# Patient Record
Sex: Female | Born: 1965 | Hispanic: Yes | Marital: Single
Health system: Southern US, Community
[De-identification: ages and names within clinical notes are randomized; demographics above are authoritative.]

---

## 2016-03-15 ENCOUNTER — Emergency Department (HOSPITAL_COMMUNITY): Payer: Self-pay

## 2016-03-15 ENCOUNTER — Encounter (HOSPITAL_COMMUNITY): Payer: Self-pay | Admitting: Certified Registered"

## 2016-03-15 ENCOUNTER — Encounter (HOSPITAL_COMMUNITY): Payer: Self-pay | Admitting: *Deleted

## 2016-03-15 ENCOUNTER — Encounter (HOSPITAL_COMMUNITY)
Admission: EM | Disposition: A | Discharge status: Home / Self Care | Payer: Self-pay | Source: Home / Self Care | Attending: Emergency Medicine

## 2016-03-15 ENCOUNTER — Emergency Department (HOSPITAL_COMMUNITY)
Admission: EM | Admit: 2016-03-15 | Discharge: 2016-03-15 | Disposition: A | Discharge status: Home / Self Care | Payer: Self-pay | Attending: Emergency Medicine | Admitting: Emergency Medicine

## 2016-03-15 DIAGNOSIS — S79912A Unspecified injury of left hip, initial encounter: Secondary | ICD-10-CM | POA: Insufficient documentation

## 2016-03-15 DIAGNOSIS — W11XXXA Fall on and from ladder, initial encounter: Secondary | ICD-10-CM | POA: Insufficient documentation

## 2016-03-15 DIAGNOSIS — M25552 Pain in left hip: Secondary | ICD-10-CM

## 2016-03-15 DIAGNOSIS — Y9389 Activity, other specified: Secondary | ICD-10-CM | POA: Insufficient documentation

## 2016-03-15 DIAGNOSIS — M25512 Pain in left shoulder: Secondary | ICD-10-CM

## 2016-03-15 DIAGNOSIS — Y998 Other external cause status: Secondary | ICD-10-CM | POA: Insufficient documentation

## 2016-03-15 DIAGNOSIS — S4992XA Unspecified injury of left shoulder and upper arm, initial encounter: Secondary | ICD-10-CM | POA: Insufficient documentation

## 2016-03-15 DIAGNOSIS — M75112 Incomplete rotator cuff tear or rupture of left shoulder, not specified as traumatic: Secondary | ICD-10-CM | POA: Insufficient documentation

## 2016-03-15 DIAGNOSIS — S7012XA Contusion of left thigh, initial encounter: Secondary | ICD-10-CM | POA: Insufficient documentation

## 2016-03-15 DIAGNOSIS — Y9289 Other specified places as the place of occurrence of the external cause: Secondary | ICD-10-CM | POA: Insufficient documentation

## 2016-03-15 LAB — CBC WITH DIFFERENTIAL/PLATELET
BASOS ABS: 0.1 10*3/uL (ref 0.0–0.1)
BASOS PCT: 1 %
EOS PCT: 3 %
Eosinophils Absolute: 0.2 10*3/uL (ref 0.0–0.7)
HCT: 35.3 % — ABNORMAL LOW (ref 36.0–46.0)
Hemoglobin: 11.9 g/dL — ABNORMAL LOW (ref 12.0–15.0)
Lymphocytes Relative: 26 %
Lymphs Abs: 2.3 10*3/uL (ref 0.7–4.0)
MCH: 29.5 pg (ref 26.0–34.0)
MCHC: 33.7 g/dL (ref 30.0–36.0)
MCV: 87.4 fL (ref 78.0–100.0)
MONO ABS: 0.5 10*3/uL (ref 0.1–1.0)
Monocytes Relative: 6 %
Neutro Abs: 5.8 10*3/uL (ref 1.7–7.7)
Neutrophils Relative %: 64 %
PLATELETS: 336 10*3/uL (ref 150–400)
RBC: 4.04 MIL/uL (ref 3.87–5.11)
RDW: 12.7 % (ref 11.5–15.5)
WBC: 8.8 10*3/uL (ref 4.0–10.5)

## 2016-03-15 LAB — BASIC METABOLIC PANEL
ANION GAP: 12 (ref 5–15)
BUN: 8 mg/dL (ref 6–20)
CALCIUM: 8.9 mg/dL (ref 8.9–10.3)
CO2: 21 mmol/L — ABNORMAL LOW (ref 22–32)
Chloride: 104 mmol/L (ref 101–111)
Creatinine, Ser: 0.6 mg/dL (ref 0.44–1.00)
GFR calc Af Amer: >60 mL/min (ref >60)
GLUCOSE: 91 mg/dL (ref 65–99)
Potassium: 3.5 mmol/L (ref 3.5–5.1)
Sodium: 137 mmol/L (ref 135–145)

## 2016-03-15 SURGERY — FIXATION, FEMUR, NECK, PERCUTANEOUS, USING SCREW
Anesthesia: General | Laterality: Left

## 2016-03-15 MED ORDER — PROPOFOL 10 MG/ML IV BOLUS
INTRAVENOUS | Status: AC
  Filled 2016-03-15: qty 20

## 2016-03-15 MED ORDER — OXYCODONE-ACETAMINOPHEN 5-325 MG PO TABS
1.0000 | ORAL_TABLET | Freq: Once | ORAL | Status: AC
  Administered 2016-03-15: 1 via ORAL
  Filled 2016-03-15: qty 1

## 2016-03-15 MED ORDER — KETOROLAC TROMETHAMINE 30 MG/ML IJ SOLN
30.0000 mg | Freq: Once | INTRAMUSCULAR | Status: AC
  Administered 2016-03-15: 30 mg via INTRAVENOUS
  Filled 2016-03-15: qty 1

## 2016-03-15 MED ORDER — OXYCODONE-ACETAMINOPHEN 5-325 MG PO TABS
ORAL_TABLET | ORAL | Status: AC
  Filled 2016-03-15: qty 1

## 2016-03-15 MED ORDER — ONDANSETRON HCL 4 MG/2ML IJ SOLN
4.0000 mg | Freq: Once | INTRAMUSCULAR | Status: AC
  Administered 2016-03-15: 4 mg via INTRAVENOUS
  Filled 2016-03-15: qty 2

## 2016-03-15 MED ORDER — MORPHINE SULFATE (PF) 4 MG/ML IV SOLN
4.0000 mg | Freq: Once | INTRAVENOUS | Status: AC
  Administered 2016-03-15: 4 mg via INTRAVENOUS
  Filled 2016-03-15: qty 1

## 2016-03-15 MED ORDER — FENTANYL CITRATE (PF) 250 MCG/5ML IJ SOLN
INTRAMUSCULAR | Status: AC
  Filled 2016-03-15: qty 5

## 2016-03-15 MED ORDER — MIDAZOLAM HCL 2 MG/2ML IJ SOLN
INTRAMUSCULAR | Status: AC
Start: 2016-03-15 — End: 2016-03-15
  Filled 2016-03-15: qty 2

## 2016-03-15 MED ORDER — OXYCODONE-ACETAMINOPHEN 5-325 MG PO TABS
1.0000 | ORAL_TABLET | ORAL | Status: DC | PRN
Start: 2016-03-15 — End: 2016-03-16
  Administered 2016-03-15: 1 via ORAL

## 2016-03-15 MED ORDER — OXYCODONE-ACETAMINOPHEN 5-325 MG PO TABS: 1.0000 | ORAL_TABLET | Freq: Four times a day (QID) | ORAL | Status: AC | PRN

## 2016-03-15 NOTE — Progress Notes (Signed)
No left hip fracture Left shoulder medium supraspinatus tear Needs shoulder immobilizer Ok to wb as tolerated left Needs to be seen in follow up Friday am for surgery next week Will need pain meds and muscle relaxer

## 2016-03-15 NOTE — ED Notes (Signed)
REpaged ortho/DEAN

## 2016-03-15 NOTE — Progress Notes (Signed)
Discussed findings with patient

## 2016-03-15 NOTE — Discharge Instructions (Signed)
Contusin (Contusion) Una contusin es un hematoma profundo. Las contusiones ocurren cuando una lesin causa un sangrado debajo de la piel. Los sntomas de hematoma incluyen dolor, hinchazn y cambio de color en la piel. La piel puede ponerse azul, morada o amarilla. CUIDADOS EN EL HOGAR   Mantenga la zona de la lesin en reposo.  Aplique hielo sobre la zona lesionada, si se lo indican.  Ponga el hielo en una bolsa plstica.  Coloque una toalla entre la piel y la bolsa de hielo.  Coloque el hielo durante 20minutos, 2 a 3veces por da.  Si se lo indican, ejerza una presin suave (compresin) en la zona de la lesin con una venda elstica. Asegrese de que la venda no est muy ajustada. Retrela y vuelva a colocarla como se lo haya indicado el mdico.  Cuando est sentado o acostado, eleve la zona de la lesin por encima del nivel del corazn, si es posible.  Tome los medicamentos de venta libre y los recetados solamente como se lo haya indicado el mdico. SOLICITE AYUDA SI:  Los sntomas no mejoran despus de varios das de tratamiento.  Los sntomas empeoran.  Tiene dificultad para mover la zona de la lesin. SOLICITE AYUDA DE INMEDIATO SI:   Siente mucho dolor.  Pierde la sensibilidad (adormecimiento) en una mano o un pie.  La mano o el pie estn plidos o fros.   Esta informacin no tiene como fin reemplazar el consejo del mdico. Asegrese de hacerle al mdico cualquier pregunta que tenga.   Document Released: 11/30/2011 Document Revised: 09/01/2015 Elsevier Interactive Patient Education 2016 [employer]  

## 2016-03-15 NOTE — ED Provider Notes (Signed)
CSN: [id number]     Arrival date & time 03/15/16  1129 History   First MD Initiated Contact with Patient 03/15/16 1504     Chief Complaint  Patient presents with  . Fall  . Pain     (Consider location/radiation/quality/duration/timing/severity/associated sxs/prior Treatment) HPI Comments: 50 year old female who complains of left hip and left shoulder pain. Just prior to arrival, the patient fell off of a ladder approximately 6 feet. She landed onto her left side. She did not strike her head or lose consciousness. She reports severe, constant pain to her left shoulder and left hip. The pain is worse with any movement. She endorses normal sensation throughout. She denies any other extremity injuries. No back pain.  Patient is a 50 y.o. female presenting with fall. The history is provided by the patient.  Fall    History reviewed. No pertinent past medical history. History reviewed. No pertinent past surgical history. History reviewed. No pertinent family history. Social History  Substance Use Topics  . Smoking status: None  . Smokeless tobacco: None  . Alcohol Use: No   OB History    No data available     Review of Systems 10 Systems reviewed and are negative for acute change except as noted in the HPI.    Allergies  Review of patient's allergies indicates no known allergies.  Home Medications   Prior to Admission medications   Medication Sig Start Date End Date Taking? Authorizing Provider  ibuprofen (ADVIL,MOTRIN) 200 MG tablet Take 800 mg by mouth every 6 (six) hours as needed for headache or mild pain.   Yes Historical Provider, MD  oxyCODONE-acetaminophen (PERCOCET) 5-325 MG tablet Take 1 tablet by mouth every 6 (six) hours as needed for severe pain. 03/15/16   Ambrose Finland Cosmo Tetreault, MD   BP 136/69 mmHg  Pulse 82  Temp(Src) 99.1 F (37.3 C) (Oral)  Resp 15  SpO2 100% Physical Exam  Constitutional: She is oriented to person, place, and time. She appears  well-developed and well-nourished. No distress.  uncomfortable  HENT:  Head: Normocephalic and atraumatic.  Moist mucous membranes  Eyes: Conjunctivae are normal. Pupils are equal, round, and reactive to light.  Neck: Neck supple.  Cardiovascular: Normal rate, regular rhythm and normal heart sounds.   No murmur heard. Pulmonary/Chest: Effort normal and breath sounds normal.  Abdominal: Soft. Bowel sounds are normal. She exhibits no distension. There is no tenderness.  Musculoskeletal: She exhibits no edema.  TTP L shoulder without obvious deformity; normal ROM L elbow and wrist; 5/5 grip strength b/l; 2+ radial pulses; unable to move L hip 2/2 pain  Neurological: She is alert and oriented to person, place, and time.  Normal sensation x all 4 ext, Fluent speech  Skin: Skin is warm and dry.  Psychiatric: She has a normal mood and affect. Judgment normal.  Nursing note and vitals reviewed.   ED Course  Procedures (including critical care time) Labs Review Labs Reviewed  BASIC METABOLIC PANEL - Abnormal; Notable for the following:    CO2 21 (*)    All other components within normal limits  CBC WITH DIFFERENTIAL/PLATELET - Abnormal; Notable for the following:    Hemoglobin 11.9 (*)    HCT 35.3 (*)    All other components within normal limits    Imaging Review Dg Chest 2 View  03/15/2016  CLINICAL DATA:  Fall from ladder. Left rib and chest pain. Unable to raise left arm. Pre-op respiratory exam for left hip fracture EXAM: CHEST  2 VIEW COMPARISON:  None. FINDINGS: The heart size and mediastinal contours are within normal limits. Both lungs are clear. No evidence of pneumothorax or hemothorax. The visualized skeletal structures are unremarkable. IMPRESSION: No active cardiopulmonary disease. Electronically Signed   By: Myles Rosenthal M.D.   On: 03/15/2016 19:29   Ct Hip Left Wo Contrast  03/15/2016  CLINICAL DATA:  Left hip pain after falling off of a ladder today. EXAM: CT OF THE LEFT  HIP WITHOUT CONTRAST TECHNIQUE: Multidetector CT imaging of the left hip was performed according to the standard protocol. Multiplanar CT image reconstructions were also generated. COMPARISON:  Radiographs dated 03/15/2016 FINDINGS: There is a subtle contour abnormality of the junction of the superior aspect of the left femoral neck and the left femoral head which I think represents a subtle impaction fracture. This could be confirmed by MRI. There is a prominent contusion of the subcutaneous fat lateral to the left greater trochanter. The visualized pelvic bones are intact. IMPRESSION: Probable subtle impaction fracture of the junction of the femoral head and the superior aspect the left femoral neck seen only on the coronal images. Soft tissue contusion lateral to the left greater trochanter. Electronically Signed   By: Francene Boyers M.D.   On: 03/15/2016 17:35   Mr Hip Left Wo Contrast  03/15/2016  CLINICAL DATA:  Left hip pain EXAM: MR OF THE LEFT HIP WITHOUT CONTRAST TECHNIQUE: Multiplanar, multisequence MR imaging was performed. No intravenous contrast was administered. COMPARISON:  None. FINDINGS: Bones: No focal marrow signal abnormality. No hip fracture, dislocation or avascular necrosis. Normal sacrum and sacroiliac joints. Superior and inferior pubic rami are intact. Articular cartilage and labrum Articular cartilage: Limited evaluation, but no focal chondral defect. Labrum: Grossly intact, but evaluation is limited by lack of intraarticular fluid. Joint or bursal effusion Joint effusion:  No joint effusion. Bursae:  No bursa formation. Muscles and tendons Flexors: Normal. Extensors: Normal. Abductors: Normal. Adductors: Normal. Rotators: Mild muscle edema in the left gluteus medius muscle. Hamstrings: Normal. Other findings Miscellaneous: Soft tissue contusion in the subcutaneous fat overlying the left hip. No inguinal hernia. No inguinal lymphadenopathy. No pelvic free fluid. IMPRESSION: 1. No hip  fracture, dislocation or avascular necrosis. 2. Mild muscle edema in the left gluteus medius muscle most concerning for muscle strain. Soft tissue contusion in the subcutaneous fat overlying the left hip. No focal fluid collection or hematoma. Electronically Signed   By: Elige Ko   On: 03/15/2016 21:59   Dg Shoulder Left  03/15/2016  CLINICAL DATA:  Fall from ladder approximately 6 feet with left shoulder pain, initial encounter EXAM: LEFT SHOULDER - 2+ VIEW COMPARISON:  None. FINDINGS: There is no evidence of fracture or dislocation. There is no evidence of arthropathy or other focal bone abnormality. Soft tissues are unremarkable. IMPRESSION: No acute abnormality noted. Electronically Signed   By: Alcide Clever M.D.   On: 03/15/2016 13:12   Dg Hip Unilat With Pelvis 2-3 Views Left  03/15/2016  CLINICAL DATA:  Patient reports falling off a ladder 6 feet. Pain in LEFT shoulder and LEFT hip. Decreased range of motion. Difficulty putting weight on LEFT leg. EXAM: DG HIP (WITH OR WITHOUT PELVIS) 2-3V LEFT COMPARISON:  None. FINDINGS: There is no visible pelvic fracture. There is no femoral neck fracture or hip dislocation. The proximal to mid femurs appear intact. There are scattered linear lucencies associated with the greater trochanter which could certainly be vascular but difficult to exclude in the setting of trauma  a nondisplaced greater trochanteric fracture. If there is point tenderness over this region or concern for such, consider noncontrast CT scan of the hip. IMPRESSION: No visible pelvic, or femoral neck fracture.  No hip dislocation. In the setting of trauma, difficult to exclude a nondisplaced greater trochanteric fracture. Consider CT scanning if concern for such. Electronically Signed   By: Elsie StainJohn T Curnes M.D.   On: 03/15/2016 13:16   I have personally reviewed and evaluated these images  as part of my medical decision-making.   EKG Interpretation None     Medications   oxyCODONE-acetaminophen (PERCOCET/ROXICET) 5-325 MG per tablet 1 tablet ( Oral Not Given 03/15/16 1534)  oxyCODONE-acetaminophen (PERCOCET/ROXICET) 5-325 MG per tablet 1 tablet (1 tablet Oral Given 03/15/16 1543)  morphine 4 MG/ML injection 4 mg (4 mg Intravenous Given 03/15/16 1819)  ondansetron (ZOFRAN) injection 4 mg (4 mg Intravenous Given 03/15/16 1819)  morphine 4 MG/ML injection 4 mg (4 mg Intravenous Given 03/15/16 1915)  oxyCODONE-acetaminophen (PERCOCET/ROXICET) 5-325 MG per tablet 1 tablet (1 tablet Oral Given 03/15/16 2230)  ketorolac (TORADOL) 30 MG/ML injection 30 mg (30 mg Intravenous Given 03/15/16 2358)    MDM   Final diagnoses:  Left hip pain  Left shoulder pain  Thigh contusion, left, initial encounter  Partial tear of rotator cuff, left   Patient presents with left hip and left shoulder pain after a 6 foot fall from a ladder. She was uncomfortable but in no acute distress at presentation. Vital signs unremarkable. No chest or abdominal pain on exam, limited range of motion of left shoulder and hip secondary to pain. She was neurovascularly intact distally. Plain films of shoulder and hip show no acute findings. Because the patient's ongoing pain and endorsement that she was unable to ambulate after the event, obtained CT of the hip to evaluate for occult fracture. Gave pt percocet for pain, Later morphine for ongoing pain.  CT showed concern for possible impaction fracture. I discussed with orthopedics, Dr. August Saucerean, who evaluated the patient and recommended MRI of left hip and left shoulder. MRI showed no fractures and only evidence of soft tissue injury. MRI did show left rotator cuff injury for which Dr. August Saucerean will see the patient in his clinic in 2 days. He discussed follow-up plan with the patient and she voiced understanding. Provided with pain medications. Patient discharged in satisfactory condition.   Laurence Spatesachel Morgan Regis Hinton, MD 03/16/16 [phone]

## 2016-03-15 NOTE — Consult Note (Signed)
Reason for Consult: Left hip and left shoulder pain Referring Physician: Dr Narda Bonds is an 50 y.o. female.  HPI: Nicole Barnes is a 50 year old female who fell 6 feet off a ladder on her left-hand side. She was "barely" able to weight-bear. 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 She is not been able to bear weight on the left-hand side. States her hip pain is worse in her shoulder pain. No previous history of injury to either structure. Denies any loss of consciousness  History reviewed. No pertinent past medical history.  History reviewed. No pertinent past surgical history.  History reviewed. No pertinent family history.  Social History:  reports that she does not drink alcohol or use illicit drugs. Her tobacco history is not on file.  Allergies: No Known Allergies  Medications: I have reviewed the patient's current medications.  Results for orders placed or performed during the hospital encounter of 03/15/16 (from the past 48 hour(s))  CBC with Differential     Status: Abnormal   Collection Time: 03/15/16  7:43 PM  Result Value Ref Range   WBC 8.8 4.0 - 10.5 K/uL   RBC 4.04 3.87 - 5.11 MIL/uL   Hemoglobin 11.9 (L) 12.0 - 15.0 g/dL   HCT 40.9 (L) 81.1 - 91.4 %   MCV 87.4 78.0 - 100.0 fL   MCH 29.5 26.0 - 34.0 pg   MCHC 33.7 30.0 - 36.0 g/dL   RDW 78.2 95.6 - 21.3 %   Platelets 336 150 - 400 K/uL   Neutrophils Relative % 64 %   Neutro Abs 5.8 1.7 - 7.7 K/uL   Lymphocytes Relative 26 %   Lymphs Abs 2.3 0.7 - 4.0 K/uL   Monocytes Relative 6 %   Monocytes Absolute 0.5 0.1 - 1.0 K/uL   Eosinophils Relative 3 %   Eosinophils Absolute 0.2 0.0 - 0.7 K/uL   Basophils Relative 1 %   Basophils Absolute 0.1 0.0 - 0.1 K/uL    Dg Chest 2 View  03/15/2016  CLINICAL DATA:  Fall from ladder. Left rib and chest pain. Unable to raise left arm. Pre-op respiratory exam for left hip fracture EXAM: CHEST  2 VIEW  COMPARISON:  None. FINDINGS: The heart size and mediastinal contours are within normal limits. Both lungs are clear. No evidence of pneumothorax or hemothorax. The visualized skeletal structures are unremarkable. IMPRESSION: No active cardiopulmonary disease. Electronically Signed   By: Myles Rosenthal M.D.   On: 03/15/2016 19:29   Ct Hip Left Wo Contrast  03/15/2016  CLINICAL DATA:  Left hip pain after falling off of a ladder today. EXAM: CT OF THE LEFT HIP WITHOUT CONTRAST TECHNIQUE: Multidetector CT imaging of the left hip was performed according to the standard protocol. Multiplanar CT image reconstructions were also generated. COMPARISON:  Radiographs dated 03/15/2016 FINDINGS: There is a subtle contour abnormality of the junction of the superior aspect of the left femoral neck and the left femoral head which I think represents a subtle impaction fracture. This could be confirmed by MRI. There is a prominent contusion of the subcutaneous fat lateral to the left greater trochanter. The visualized pelvic bones are intact. IMPRESSION: Probable subtle impaction fracture of the junction of the femoral head and the superior aspect the left femoral neck seen only on the coronal images. Soft tissue contusion lateral to the left greater trochanter. Electronically Signed   By: Fayrene Fearing  Maxwell M.D.   On: 03/15/2016 17:35   Dg Shoulder Left  03/15/2016  CLINICAL DATA:  Fall from ladder approximately 6 feet with left shoulder pain, initial encounter EXAM: LEFT SHOULDER - 2+ VIEW COMPARISON:  None. FINDINGS: There is no evidence of fracture or dislocation. There is no evidence of arthropathy or other focal bone abnormality. Soft tissues are unremarkable. IMPRESSION: No acute abnormality noted. Electronically Signed   By: Alcide CleverMark  Lukens M.D.   On: 03/15/2016 13:12   Dg Hip Unilat With Pelvis 2-3 Views Left  03/15/2016  CLINICAL DATA:  Patient reports falling off a ladder 6 feet. Pain in LEFT shoulder and LEFT hip. Decreased  range of motion. Difficulty putting weight on LEFT leg. EXAM: DG HIP (WITH OR WITHOUT PELVIS) 2-3V LEFT COMPARISON:  None. FINDINGS: There is no visible pelvic fracture. There is no femoral neck fracture or hip dislocation. The proximal to mid femurs appear intact. There are scattered linear lucencies associated with the greater trochanter which could certainly be vascular but difficult to exclude in the setting of trauma a nondisplaced greater trochanteric fracture. If there is point tenderness over this region or concern for such, consider noncontrast CT scan of the hip. IMPRESSION: No visible pelvic, or femoral neck fracture.  No hip dislocation. In the setting of trauma, difficult to exclude a nondisplaced greater trochanteric fracture. Consider CT scanning if concern for such. Electronically Signed   By: Elsie StainJohn T Curnes M.D.   On: 03/15/2016 13:16    Review of Systems  Constitutional: Negative.   HENT: Negative.   Eyes: Negative.   Respiratory: Negative.   Cardiovascular: Negative.   Gastrointestinal: Negative.   Genitourinary: Negative.   Musculoskeletal: Positive for joint pain.  Skin: Negative.   Neurological: Negative.   Endo/Heme/Allergies: Negative.   Psychiatric/Behavioral: Negative.    Blood pressure 136/69, pulse 82, temperature 99.1 F (37.3 C), temperature source Oral, resp. rate 15, SpO2 100 %. Physical Exam  Constitutional: She appears well-developed.  HENT:  Head: Normocephalic.  Eyes: Pupils are equal, round, and reactive to light.  Neck: Normal range of motion.  Cardiovascular: Normal rate.   Respiratory: Effort normal.  Neurological: She is alert.  Skin: Skin is warm.  Psychiatric: She has a normal mood and affect.   the patient's neck is nontender. Her movements intact she has pain with shoulder range of motion but no coarse primary crepitus with passive range of motion but her sensory function to the hand is intact deltoid fires shows a rotator cuff weakness to  external rotation on the left compared to the right left hip is examined she has good ankle dorsi and plantarflexion strength palpable pedal pulses no knee effusion on either side not much in the way of groin pain with internal/external rotation of the left hip base of her pain is localized to the lateral trochanteric region  Assessment/Plan:  Impression is possible left hip nondisplaced fracture although her exam doesn't really match that particular clinical diagnosis. CT scan ordered by Dr. Clarene DukeLittle is suggestive but not confirmatory of impacted femoral neck fracture   left shoulder has an exam consistent with rotator cuff tearing. Plan at this time is for MRI scan of the left shoulder because of mechanism of injury is consistent with possible rotator cuff tearing as well as MRI scan of the left hip to determine definitively whether or not there is a femoral neck fracture present. We will keep her nothing by mouth until the scans of been completed  Nevaan Bunton SCOTT 03/15/2016, 8:11  PM

## 2016-03-15 NOTE — ED Notes (Signed)
Pt reports falling off a ladder, approx 6 ft. Denies hitting head, denies loc. Having pain to left shoulder and left hip. Decreased rom to left arm, +radial pulse.

## 2017-08-03 IMAGING — MR MR SHOULDER*L* W/O CM
4 of 6 series · 18 of 40 positions shown · non-contrast
Comparison: Radiograph same date.

CLINICAL DATA: Severe left shoulder pain after falling 6 feet from
a ladder today.

EXAM:
MRI OF THE LEFT SHOULDER WITHOUT CONTRAST
TECHNIQUE: Multiplanar, multisequence MR imaging of the shoulder was performed.
No intravenous contrast was administered.

[Series 4: T2 fat-sat · oblique · 3.0mm · 0.23mm/px · 5 of 29 slices shown (1 of 3)]
[im 1/29]
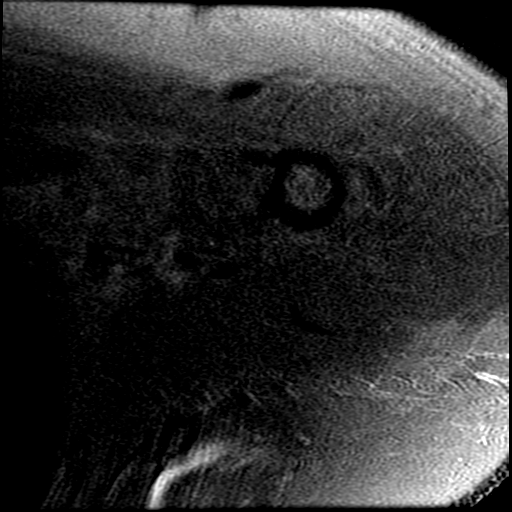
[im 5/29]
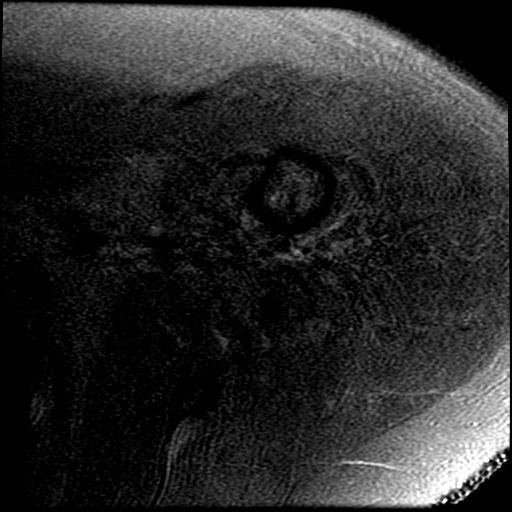
[im 10/29]
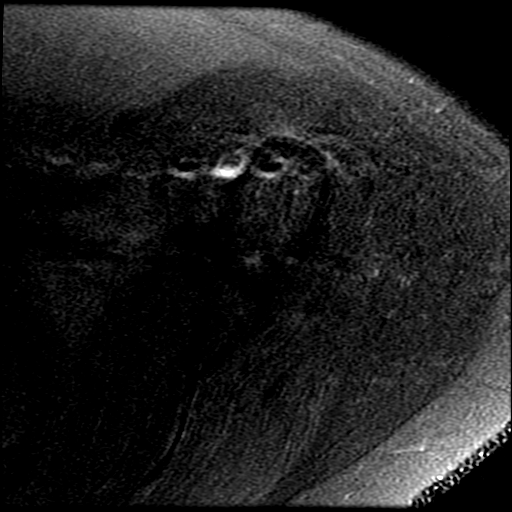
[im 15/29]
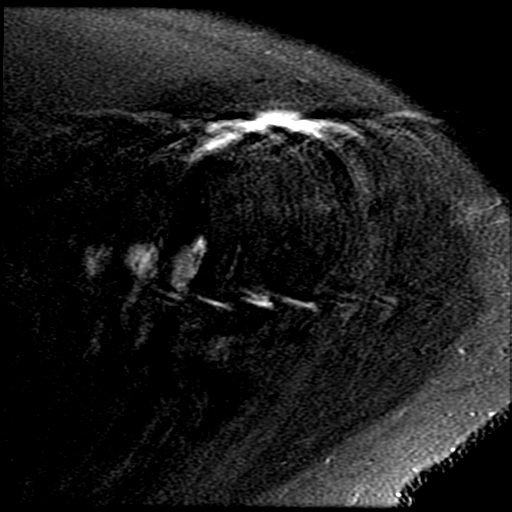
[im 24/29]
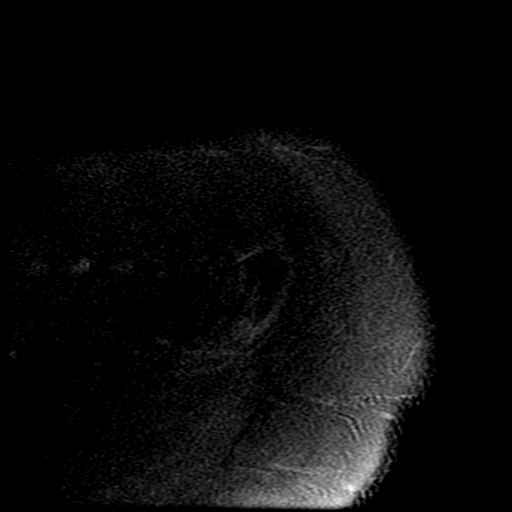

[Series 5: T2 fat-sat · oblique · 3.0mm · 0.27mm/px · 3 of 29 slices shown (2 of 3)]
[im 5/29]
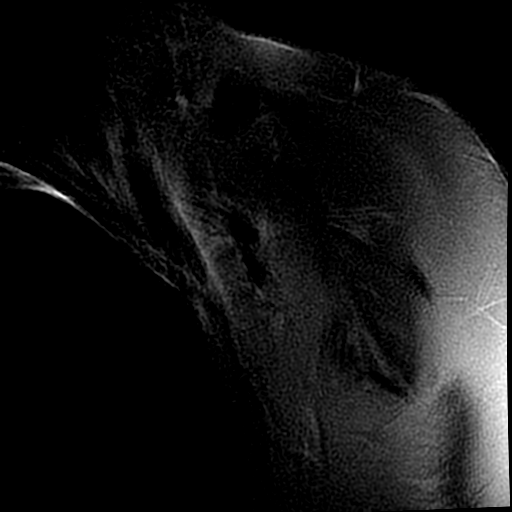
[im 15/29]
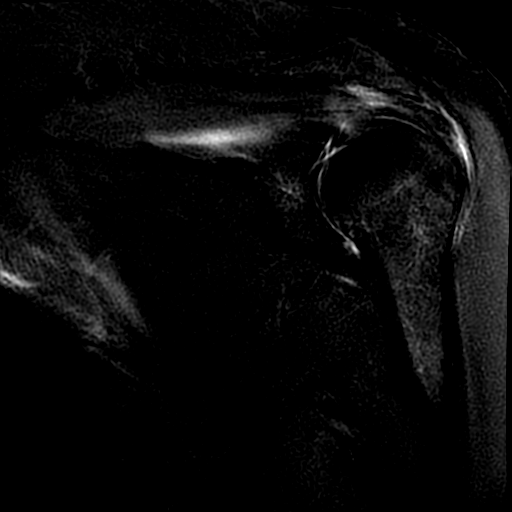
[im 24/29]
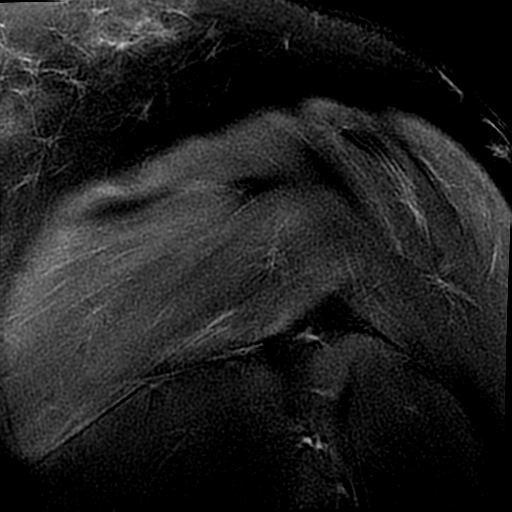

[Series 7: T2 fat-sat · oblique · 5.0mm · 0.43mm/px · 3 of 24 slices shown (3 of 3)]
[im 5/24]
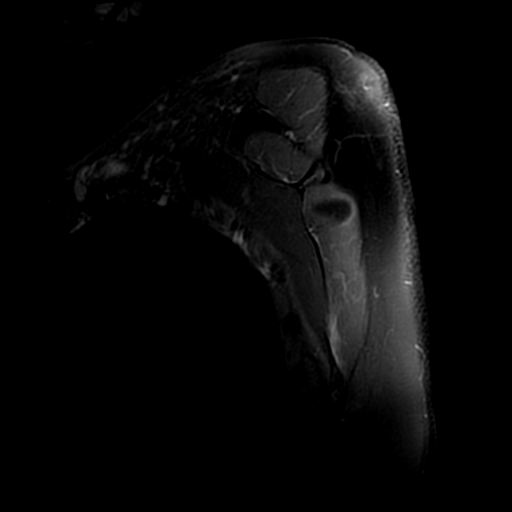
[im 14/24]
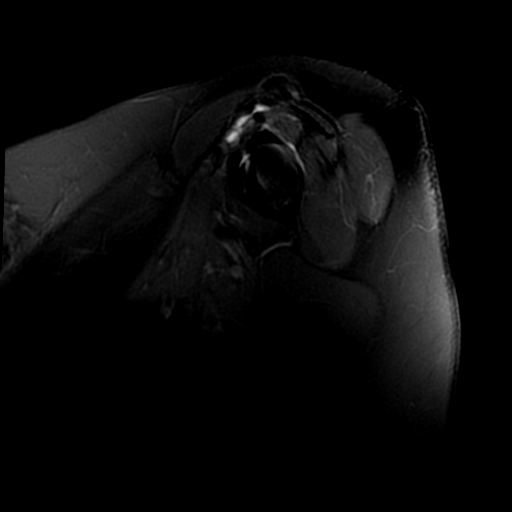
[im 24/24]
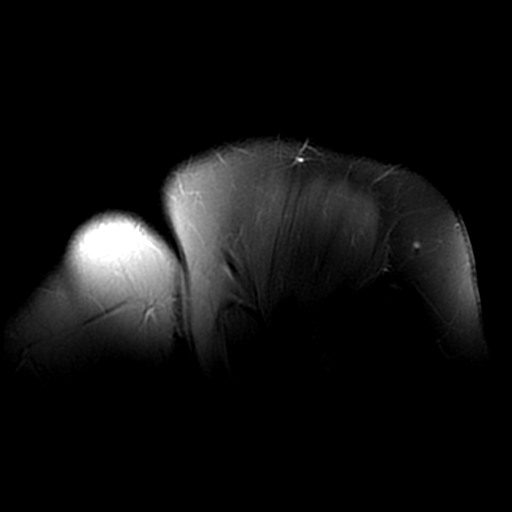

[Series 8: PD · oblique · 3.0mm · 0.27mm/px · 7 of 29 slices shown]
[im 1/29]
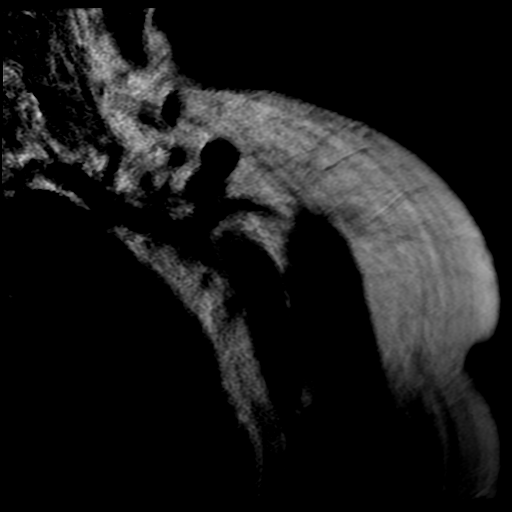
[im 5/29]
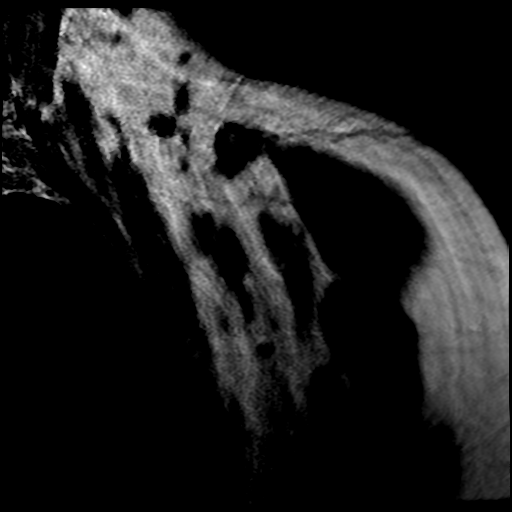
[im 10/29]
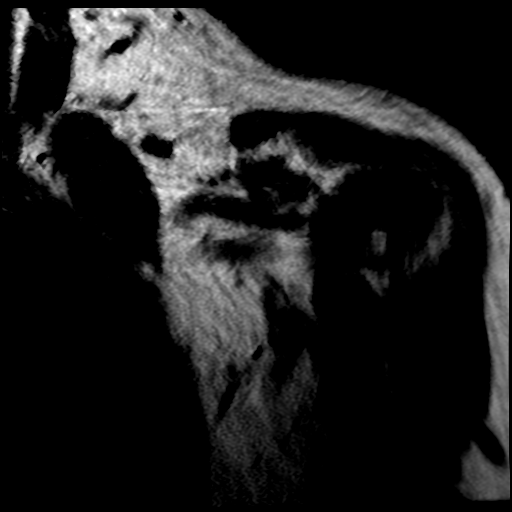
[im 15/29]
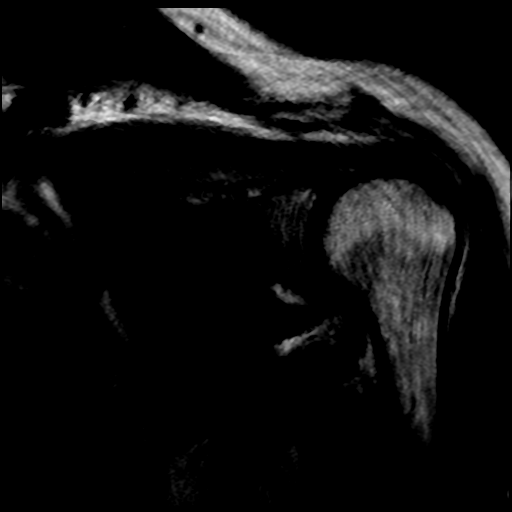
[im 19/29]
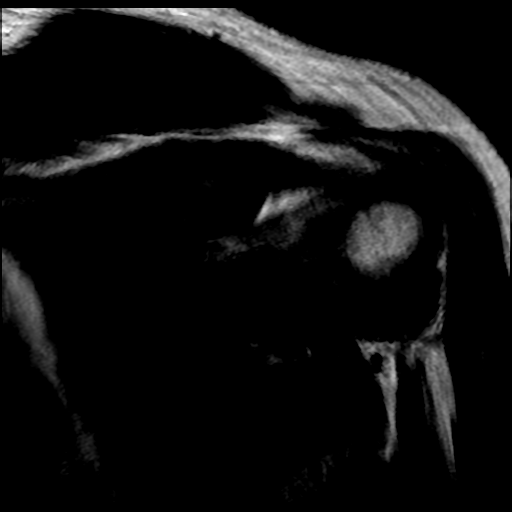
[im 24/29]
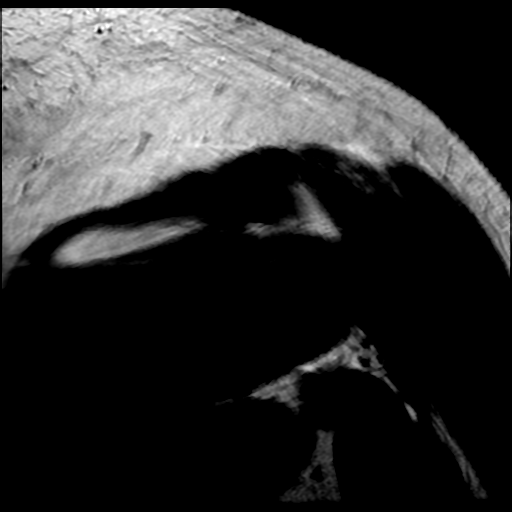
[im 29/29]
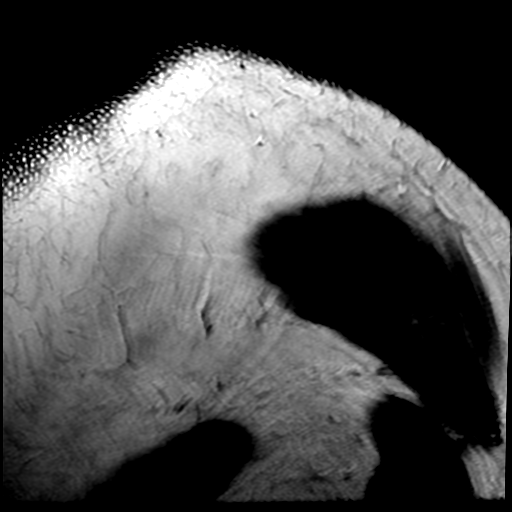

[18 of 40 positions shown; findings below may reference images not displayed]

FINDINGS: Despite efforts by the technologist and patient, significant motion
artifact is present on today's exam and could not be eliminated.
This reduces exam sensitivity and specificity.

Rotator cuff: There is a full-thickness insertional tear of the
supraspinatus tendon anteriorly with approximately 11 mm of tendon
retraction on the coronal images. This involves approximately half
of the tendon width. There is associated prominent sentinel cyst
formation tracking medially into the supraspinatus muscle. The
additional components of the rotator cuff appear intact, although
are not optimally evaluated due to the motion.

Muscles: As above, sentinel cyst formation in the supraspinatus
muscle. There is a small amount of edema tracking between the teres
minor muscle and the inferior scapular body. There is no other focal
muscular atrophy or edema. There is a lipoma within the superior
aspect of the infraspinatus muscle.

Biceps long head:  Intact and normally positioned.

Acromioclavicular Joint: The acromion is type 2. There are mild
acromioclavicular degenerative changes. A moderate amount of fluid
in the subacromial -subdeltoid bursa communicates with the shoulder
joint via the rotator cuff tear.

Glenohumeral Joint: No significant shoulder joint effusion or
glenohumeral arthropathy.

Labrum:  No evidence of labral tear.  Labral assessment limited.

Bones: No acute or significant extra-articular osseous findings.
IMPRESSION: 1. Full-thickness moderately retracted insertional tear of the
supraspinatus tendon with associated sentinel cyst formation.
2. No other acute findings.
3. Detail limited by motion artifact.
